# Patient Record
Sex: Female | Born: 1965 | Race: White | Hispanic: No | Marital: Married | Smoking: Never smoker
Health system: Southern US, Community
[De-identification: ages and names within clinical notes are randomized; demographics above are authoritative.]

## PROBLEM LIST (undated history)

## (undated) DIAGNOSIS — R011 Cardiac murmur, unspecified: Secondary | ICD-10-CM

## (undated) DIAGNOSIS — N924 Excessive bleeding in the premenopausal period: Secondary | ICD-10-CM

## (undated) HISTORY — DX: Cardiac murmur, unspecified: R01.1

## (undated) HISTORY — DX: Excessive bleeding in the premenopausal period: N92.4

---

## 2002-10-16 ENCOUNTER — Other Ambulatory Visit: Admission: RE | Admit: 2002-10-16 | Discharge: 2002-10-16 | Payer: Self-pay | Admitting: Obstetrics and Gynecology

## 2003-11-11 ENCOUNTER — Other Ambulatory Visit: Admission: RE | Admit: 2003-11-11 | Discharge: 2003-11-11 | Payer: Self-pay | Admitting: Obstetrics and Gynecology

## 2004-02-17 ENCOUNTER — Emergency Department (HOSPITAL_COMMUNITY): Admission: EM | Admit: 2004-02-17 | Discharge: 2004-02-17 | Payer: Self-pay | Admitting: Emergency Medicine

## 2004-12-26 ENCOUNTER — Other Ambulatory Visit: Admission: RE | Admit: 2004-12-26 | Discharge: 2004-12-26 | Payer: Self-pay | Admitting: Obstetrics and Gynecology

## 2005-03-13 ENCOUNTER — Ambulatory Visit (HOSPITAL_COMMUNITY): Admission: RE | Admit: 2005-03-13 | Discharge: 2005-03-13 | Payer: Self-pay | Admitting: Obstetrics and Gynecology

## 2010-02-24 ENCOUNTER — Encounter: Admission: RE | Admit: 2010-02-24 | Discharge: 2010-02-24 | Payer: Self-pay | Admitting: Obstetrics and Gynecology

## 2014-07-28 ENCOUNTER — Other Ambulatory Visit (HOSPITAL_COMMUNITY): Payer: Self-pay | Admitting: Obstetrics and Gynecology

## 2014-07-28 ENCOUNTER — Ambulatory Visit (HOSPITAL_COMMUNITY): Payer: Managed Care, Other (non HMO) | Attending: Cardiology | Admitting: Radiology

## 2014-07-28 DIAGNOSIS — R011 Cardiac murmur, unspecified: Secondary | ICD-10-CM | POA: Diagnosis present

## 2014-07-28 NOTE — Progress Notes (Signed)
Echocardiogram performed.  

## 2014-08-18 ENCOUNTER — Ambulatory Visit: Payer: Managed Care, Other (non HMO) | Admitting: Cardiology

## 2014-09-09 ENCOUNTER — Encounter: Payer: Self-pay | Admitting: Cardiology

## 2014-09-09 ENCOUNTER — Ambulatory Visit (INDEPENDENT_AMBULATORY_CARE_PROVIDER_SITE_OTHER): Payer: Managed Care, Other (non HMO) | Admitting: Cardiology

## 2014-09-09 VITALS — BP 124/76 | HR 59 | Ht 63.0 in | Wt 136.0 lb

## 2014-09-09 DIAGNOSIS — R011 Cardiac murmur, unspecified: Secondary | ICD-10-CM | POA: Diagnosis not present

## 2014-09-09 DIAGNOSIS — I517 Cardiomegaly: Secondary | ICD-10-CM

## 2014-09-09 NOTE — Patient Instructions (Signed)
Medication Instructions:  Your physician recommends that you continue on your current medications as directed. Please refer to the Current Medication list given to you today.   Labwork: None   Testing/Procedures: Your physician has requested that you have an exercise tolerance test. For further information please visit [email or web address] Please also follow instruction sheet, as given.  Your physician has requested that you have an echocardiogram. Echocardiography is a painless test that uses sound waves to create images of your heart. It provides your doctor with information about the size and shape of your heart and how well your heart's chambers and valves are working. This procedure takes approximately one hour. There are no restrictions for this procedure. ( To be scheduled in 2 years)  Follow-Up: Your physician wants you to follow-up in: 2 years with Dr.Nelson You will receive a reminder letter in the mail two months in advance. If you don't receive a letter, please call our office to schedule the follow-up appointment.   Any Other Special Instructions Will Be Listed Below (If Applicable).

## 2014-09-09 NOTE — Progress Notes (Signed)
Patient ID: Felicia Rios Dudzinski, female   DOB: Jun 13, 1965, 49 y.o.   MRN: [id number]      Cardiology Office Note   Date:  09/09/2014   ID:  Felicia Rios Sheppard, DOB Jun 13, 1965, MRN [id number]  PCP:  No primary care provider on file.  Cardiologist:   Lars MassonNELSON, Dedee Liss H, MD   Chief complain: Murmur, LVH   History of Present Illness: Felicia Rios Picking is a 49 y.o. female who presents for evaluation of murmur and LVH. The patient is a very pleasant and healthy young female who has no prior medical history and is currently not taking any medication. She was found to have murmur on her annual physical exam and was referred for echocardiogram. Echo showed mild concentric LVH and moderate basal septal hypertrophy (personally reviewed). The patient states that she is very active, exercising on a daily basis and is asymptomatic. She denies CP, DOE, LE edema, orthopnea. She denies any family h/o CAD, CHF or SCD. No h/o dizziness or syncope.    Past Medical History  Diagnosis Date  . Perimenopausal menorrhagia   . Systolic murmur    No past surgical history on file.  No current outpatient prescriptions on file.   No current facility-administered medications for this visit.   Allergies:   Review of patient's allergies indicates no known allergies.   Social History:  The patient  reports that she has never smoked. She has never used smokeless tobacco.   Family History:  The patient's family history includes Breast cancer in her maternal aunt and maternal grandmother; Healthy in her brother and sister; Hypertension in her father and mother; Lung cancer in her mother.   ROS:  Please see the history of present illness.   Otherwise, review of systems are positive for none.   All other systems are reviewed and negative.   PHYSICAL EXAM: VS:  BP 124/76 mmHg  Pulse 59  Ht 5\' 3"  (1.6 m)  Wt 136 lb (61.689 kg)  BMI 24.10 kg/m2  LMP 05/11/2014 , BMI Body mass index is 24.1 kg/(m^2). GEN: Well  nourished, well developed, in no acute distress HEENT: normal Neck: no JVD, carotid bruits, or masses Cardiac: RRR; 2/6 systolic murmur, rubs, or gallops,no edema  Respiratory:  clear to auscultation bilaterally, normal work of breathing GI: soft, nontender, nondistended, + BS MS: no deformity or atrophy Skin: warm and dry, no rash Neuro:  Strength and sensation are intact Psych: euthymic mood, full affect   EKG:  EKG is ordered today. The ekg ordered today demonstrates SR, normal ECG  Recent Labs: No results found for requested labs within last 365 days.   Lipid Panel No results found for: CHOL, TRIG, HDL, CHOLHDL, VLDL, LDLCALC, LDLDIRECT    Wt Readings from Last 3 Encounters:  09/09/14 136 lb (61.689 kg)    Echocardiogram 07/2014  - Left ventricle: The cavity size was normal. There was mild focal basal hypertrophy of the septum. Systolic function was normal. The estimated ejection fraction was in the range of 55% to 60%. Wall motion was normal; there were no regional wall motion abnormalities. Doppler parameters are consistent with abnormal left ventricular relaxation (grade 1 diastolic dysfunction). - Aortic valve: There was no stenosis. There was trivial regurgitation. - Mitral valve: There was no significant regurgitation. - Right ventricle: The cavity size was normal. Systolic function was normal. - Pulmonary arteries: No complete TR doppler jet so unable to estimate PA systolic pressure. - Inferior vena cava: The vessel was normal in size.  The respirophasic diameter changes were in the normal range (>= 50%), consistent with normal central venous pressure.  Impressions:  - Normal LV size with mild focal basal septal hypertrophy. EF 55-60%. Normal RV size and systolic function. No significant valvular abnormalities.    ASSESSMENT AND PLAN:  1.   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  Also, I have personally reviewed patient's echocardiogram and there is no suspicion for hypertrophic cardiomyopathy - basal septum measures 14 mm, typically > 18 mm in HCM.   If normal exercise treadmill test I would follow in 2 years with repeat echocardiogram, however I don't expect any change since HCM phenotype is usually expressed in 40'.  Current medicines are reviewed at length with the patient today.  The patient does not have concerns regarding medicines.  The following changes have been made:  no change  Labs/ tests ordered today include:   Orders Placed This Encounter  Procedures  . Exercise Tolerance Test  . EKG 12-Lead     Disposition:   FU with Tobias AlexanderNELSON, Carson Bogden H in 2 year  Signed, Lars MassonNELSON, Armany Mano H, MD  09/09/2014 2:47 PM    [facility] [address], [place], [place]  [address] Phone: [phone]; Fax: [phone]

## 2014-09-15 ENCOUNTER — Encounter: Payer: Self-pay | Admitting: Obstetrics and Gynecology

## 2014-10-08 ENCOUNTER — Encounter: Payer: Self-pay | Admitting: Physician Assistant

## 2014-10-22 ENCOUNTER — Encounter: Payer: Managed Care, Other (non HMO) | Admitting: Physician Assistant

## 2014-11-11 ENCOUNTER — Telehealth (HOSPITAL_COMMUNITY): Payer: Self-pay

## 2014-11-11 NOTE — Telephone Encounter (Signed)
Encounter complete. 

## 2014-11-16 ENCOUNTER — Ambulatory Visit (HOSPITAL_COMMUNITY)
Admission: RE | Admit: 2014-11-16 | Discharge: 2014-11-16 | Disposition: A | Discharge status: Home / Self Care | Payer: Managed Care, Other (non HMO) | Source: Ambulatory Visit | Attending: Cardiology | Admitting: Cardiology

## 2014-11-16 DIAGNOSIS — I517 Cardiomegaly: Secondary | ICD-10-CM | POA: Diagnosis not present

## 2014-11-16 LAB — EXERCISE TOLERANCE TEST
Estimated workload: 13.4 METS
Exercise duration (min): 11 min
MPHR: 170 {beats}/min
Peak HR: 173 {beats}/min
Percent HR: 101 %
RPE: 15
Rest HR: 83 {beats}/min

## 2014-11-22 ENCOUNTER — Telehealth: Payer: Self-pay | Admitting: Cardiology

## 2014-11-22 ENCOUNTER — Telehealth: Payer: Self-pay | Admitting: *Deleted

## 2014-11-22 DIAGNOSIS — I10 Essential (primary) hypertension: Secondary | ICD-10-CM | POA: Insufficient documentation

## 2014-11-22 MED ORDER — LISINOPRIL 2.5 MG PO TABS: 2.5000 mg | ORAL_TABLET | Freq: Every day | ORAL | Status: DC

## 2014-11-22 NOTE — Telephone Encounter (Signed)
Informed the pt that her GXT results are in, and the preliminary has been read, but Dr Delton SeeNelson has not given her final interpretation on this study yet.  Informed the pt that once she reviews and advises on this study, I will follow-up with her based on what she said.  Pt verbalized understanding and agrees with this plan.

## 2014-11-22 NOTE — Telephone Encounter (Signed)
Pt calling to ask if its safe to exercise, walk daily.  Advised the pt that, that's advisable for her to have an exercise regimen daily. Pt verbalized understanding and gracious for all the assistance provided.

## 2014-11-22 NOTE — Telephone Encounter (Signed)
She has Negative stress test for ischemia but hypertensive response to stress (and left ventricular hypertrophy on echocardiogram).         I would start her on lisinopril 2.5 mg po daily followed by BMP in 1 month.             ----- Message -----     From: Loa SocksIvy M Karsynn Deweese, LPN     Sent: 1/61/09607/18/2016 10:28 AM      To: Lars MassonKatarina H Nelson, MD    Contacted the pt to inform her that per Dr Delton SeeNelson her ETT results showed that she had a negative stress test for ischemia but hypertensive response to stress (and left ventricular hypertrophy on echo). Informed the pt that per Dr Delton SeeNelson she recommends the pt start taking lisinopril 2.5 mg po daily and have a lab (BMET) in one month.  Confirmed the pharmacy of choice with the pt.  Scheduled the pt a lab appt to check a BMET on 12/23/14 at our office.  Pt verbalized understanding and agrees with this plan.

## 2014-11-22 NOTE — Telephone Encounter (Signed)
New message ° ° ° ° °Want stress test results °

## 2014-11-22 NOTE — Telephone Encounter (Signed)
She has Negative stress test for ischemia but hypertensive response to stress (and left ventricular hypertrophy on echocardiogram).         I would start her on lisinopril 2.5 mg po daily followed by BMP in 1 month.             ----- Message -----     From: Ivy M Martin, LPN     Sent: 11/22/2014 10:28 AM      To: Katarina H Nelson, MD    Contacted the pt to inform her that per Dr Nelson her ETT results showed that she had a negative stress test for ischemia but hypertensive response to stress (and left ventricular hypertrophy on echo). Informed the pt that per Dr Nelson she recommends the pt start taking lisinopril 2.5 mg po daily and have a lab (BMET) in one month.  Confirmed the pharmacy of choice with the pt.  Scheduled the pt a lab appt to check a BMET on 12/23/14 at our office.  Pt verbalized understanding and agrees with this plan. 

## 2014-11-22 NOTE — Telephone Encounter (Signed)
Follow up      [facility]

## 2014-12-23 ENCOUNTER — Other Ambulatory Visit (INDEPENDENT_AMBULATORY_CARE_PROVIDER_SITE_OTHER): Payer: Managed Care, Other (non HMO) | Admitting: *Deleted

## 2014-12-23 DIAGNOSIS — I10 Essential (primary) hypertension: Secondary | ICD-10-CM

## 2014-12-23 LAB — BASIC METABOLIC PANEL
BUN: 13 mg/dL (ref 6–23)
CO2: 28 mEq/L (ref 19–32)
Calcium: 9.3 mg/dL (ref 8.4–10.5)
Chloride: 104 mEq/L (ref 96–112)
Creatinine, Ser: 0.84 mg/dL (ref 0.40–1.20)
GFR: 76.06 mL/min (ref >60.00)
Glucose, Bld: 118 mg/dL — ABNORMAL HIGH (ref 70–99)
Potassium: 4 mEq/L (ref 3.5–5.1)
Sodium: 138 mEq/L (ref 135–145)

## 2015-02-09 ENCOUNTER — Ambulatory Visit (INDEPENDENT_AMBULATORY_CARE_PROVIDER_SITE_OTHER): Payer: Managed Care, Other (non HMO) | Admitting: Cardiology

## 2015-02-09 ENCOUNTER — Encounter: Payer: Self-pay | Admitting: Cardiology

## 2015-02-09 VITALS — BP 124/72 | HR 71 | Ht 63.0 in | Wt 136.6 lb

## 2015-02-09 DIAGNOSIS — R000 Tachycardia, unspecified: Secondary | ICD-10-CM | POA: Diagnosis not present

## 2015-02-09 DIAGNOSIS — I517 Cardiomegaly: Secondary | ICD-10-CM | POA: Diagnosis not present

## 2015-02-09 DIAGNOSIS — I10 Essential (primary) hypertension: Secondary | ICD-10-CM | POA: Diagnosis not present

## 2015-02-09 MED ORDER — ATENOLOL 25 MG PO TABS: 25.0000 mg | ORAL_TABLET | Freq: Every day | ORAL | Status: DC

## 2015-02-09 NOTE — Progress Notes (Signed)
Patient ID: Felicia Rios, female   DOB: 1966/02/13, 49 y.o.   MRN: [id number]      Cardiology Office Note   Date:  02/09/2015   ID:  Felicia Rios, DOB [birth year], MRN [id number]  PCP:  Delorse Lek, MD  Cardiologist:   Lars Masson, MD   Chief complain: Murmur, LVH   History of Present Illness: Felicia Rios is a 49 y.o. female who presents for evaluation of murmur and LVH. The patient is a very pleasant and healthy young female who has no prior medical history and is currently not taking any medication. She was found to have murmur on her annual physical exam and was referred for echocardiogram. Echo showed mild concentric LVH and moderate basal septal hypertrophy (personally reviewed). The patient states that she is very active, exercising on a daily basis and is asymptomatic. She denies CP, DOE, LE edema, orthopnea. She denies any family h/o CAD, CHF or SCD. No h/o dizziness or syncope.   She underwent an exercise treadmill stress test that showed excellent exercise capacity and no ischemia but hypertensive response to stress. She was started on lisinopril. Today she denies CP, SOB, however she states that she is tachycardic while running slowly (approximately 3 miles per day). Her HR goes up to 190 BPM and is associated with DOE.    Past Medical History  Diagnosis Date  . Perimenopausal menorrhagia   . Systolic murmur    No past surgical history on file.  Current Outpatient Prescriptions  Medication Sig Dispense Refill  . lisinopril (PRINIVIL,ZESTRIL) 2.5 MG tablet Take 1 tablet (2.5 mg total) by mouth daily. 90 tablet 3  . Multiple Vitamins-Minerals (MULTIVITAMIN WITH MINERALS) tablet Take 1 tablet by mouth daily.     No current facility-administered medications for this visit.   Allergies:   Review of patient's allergies indicates no known allergies.   Social History:  The patient  reports that she has never smoked. She has never used smokeless tobacco.  She reports that she drinks alcohol. She reports that she does not use illicit drugs.   Family History:  The patient's family history includes Breast cancer in her maternal aunt and maternal grandmother; Healthy in her brother and sister; Hypertension in her father and mother; Lung cancer in her mother.   ROS:  Please see the history of present illness.   Otherwise, review of systems are positive for none.   All other systems are reviewed and negative.   PHYSICAL EXAM: VS:  BP 124/72 mmHg  Pulse 71  Ht  (1.6 m)  Wt 136 lb 9.6 oz (61.961 kg)  BMI 24.20 kg/m2 , BMI Body mass index is 24.2 kg/(m^2). GEN: Well nourished, well developed, in no acute distress HEENT: normal Neck: no JVD, carotid bruits, or masses Cardiac: RRR; 2/6 systolic murmur, rubs, or gallops,no edema  Respiratory:  clear to auscultation bilaterally, normal work of breathing GI: soft, nontender, nondistended, + BS MS: no deformity or atrophy Skin: warm and dry, no rash Neuro:  Strength and sensation are intact Psych: euthymic mood, full affect   EKG:  EKG is ordered today. The ekg ordered today demonstrates SR, normal ECG  Recent Labs: 12/23/2014: BUN 13; Creatinine, Ser 0.84; Potassium 4.0; Sodium 138   Lipid Panel No results found for: CHOL, TRIG, HDL, CHOLHDL, VLDL, LDLCALC, LDLDIRECT    Wt Readings from Last 3 Encounters:  02/09/15 136 lb 9.6 oz (61.961 kg)  09/09/14 136 lb (61.689 kg)    Echocardiogram  07/2014  - Left ventricle: The cavity size was normal. There was mild focal basal hypertrophy of the septum. Systolic function was normal. The estimated ejection fraction was in the range of 55% to 60%. Wall motion was normal; there were no regional wall motion abnormalities. Doppler parameters are consistent with abnormal left ventricular relaxation (grade 1 diastolic dysfunction). - Aortic valve: There was no stenosis. There was trivial regurgitation. - Mitral valve: There was no  significant regurgitation. - Right ventricle: The cavity size was normal. Systolic function was normal. - Pulmonary arteries: No complete TR doppler jet so unable to estimate PA systolic pressure. - Inferior vena cava: The vessel was normal in size. The respirophasic diameter changes were in the normal range (>= 50%), consistent with normal central venous pressure.  Impressions: - Normal LV size with mild focal basal septal hypertrophy. EF 55-60%. Normal RV size and systolic function. No significant valvular abnormalities.  ETT: 11/16/2014 Response to Stress There was no ST segment deviation noted during stress.  Arrhythmias during stress: none.  Arrhythmias during recovery: none.  There were no significant arrhythmias noted during the test.  ECG was interpretable and conclusive.  There was significant baseline artifact at peak exercise. While somewhat difficult to interpret there is no suggestion of ischemia and her exercise tolerance was excellent without symptoms.   The patient had an excellent exercise tolerance. There was no chest pain. There was an appropriate level of dyspnea. There were no arrhythmias, a normal heart rate response. There was a hypertensive BP response. There were no ischemic ST T wave changes and a normal heart rate recovery.  Hypertensive BP response. With a low pretest probability of obstructive coronary disease I would not suggest further ischemia testing. If however, there is a more moderate suspicion of any obstructive coronary disease given the baseline artifact with peak exercise more sensitive testing might be suggested.  She will follow with Dr. Delton See for her hypertensive blood pressure response    ASSESSMENT AND PLAN:  1.   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 Also, I have personally reviewed patient's  echocardiogram and there is no suspicion for hypertrophic cardiomyopathy - basal septum measures 14 mm, typically > 18 mm in HCM.  Started on lisinopril, however tachycardia during exercise, we will switch to atenolol 25 mg po daily.   2. Exercise induced tachycardia - max HR should be < 160 BPM, start atenolol 25 mg po daily.    Disposition:   FU with Zakariah Urwin H in 6 months.  Signed, Lars Masson, MD  02/09/2015 10:30 AM    [facility] [address] [place], [place], [place]  [address] Phone: [phone]; Fax: [phone]

## 2015-02-09 NOTE — Patient Instructions (Signed)
Medication Instructions:   STOP LISINOPRIL NOW  START ATENOLOL 25 MG ONCE DAILY      Follow-Up:  Your physician wants you to follow-up in: 6 MONTHS WITH DR Johnell Comings will receive a reminder letter in the mail two months in advance. If you don't receive a letter, please call our office to schedule the follow-up appointment.

## 2015-02-24 ENCOUNTER — Encounter: Payer: Self-pay | Admitting: Cardiology

## 2015-02-25 NOTE — Telephone Encounter (Signed)
Lm to call back

## 2015-02-28 MED ORDER — ATENOLOL 25 MG PO TABS: 12.5000 mg | ORAL_TABLET | Freq: Every day | ORAL | Status: DC

## 2015-02-28 NOTE — Telephone Encounter (Signed)
Please call patient today and instruct the patient to take just half of atenolol daily = 12.5 mg po daily.        Felicia Rios, please call her in 1 week and ask how she feels with a new dose.        Thank you!    Notified the pt that per Dr Delton SeeNelson, based on the information she mycharted us on 10/20, Dr Delton SeeNelson recommends that the pt should cut her atenolol in 1/2 and start taking atenolol 12.5 mg po daily, then either call us or mychart us back in one week to report how she is feeling on the new dose.  Pt states she does not need any refills of this medication at this time, and she has a pill splitter at home to cut the 25 mg atenolol in 1/2.  Pt reports that she will notify us of her current symptoms in one week via mychart.  Pt verbalized understanding and agrees with this plan.

## 2015-03-04 ENCOUNTER — Other Ambulatory Visit: Payer: Self-pay | Admitting: Cardiology

## 2015-03-04 ENCOUNTER — Telehealth: Payer: Self-pay | Admitting: Cardiology

## 2015-03-04 MED ORDER — METOPROLOL TARTRATE 25 MG PO TABS: 12.5000 mg | ORAL_TABLET | Freq: Two times a day (BID) | ORAL | Status: DC

## 2015-03-04 NOTE — Telephone Encounter (Signed)
Called patient and advised her to discontinue the atenolol and begin metoprolol 12.5mg  twice a day.  Sent script to her pharmacy, [facility] [place].

## 2015-03-04 NOTE — Telephone Encounter (Signed)
Called patient, patient stated that she is still feeling weird with the reduced atenolol dose (25mg  to 12.5mg ).  Heart beat seems more pronounced or obvious feeling when she is inactive.  Resting heart rate now drops back into the 50's vs 40's from 150's when running.  Wants to know what she should do.

## 2015-03-04 NOTE — Telephone Encounter (Signed)
Please switch her from atenolol to metoprolol 12.5 mg po BID.

## 2015-03-04 NOTE — Telephone Encounter (Signed)
NewMessage  Pt calling to speak w RN concerning continued problems w/ Atenolol. Please call back and discuss.

## 2015-03-11 ENCOUNTER — Encounter: Payer: Self-pay | Admitting: Cardiology

## 2015-03-11 NOTE — Telephone Encounter (Signed)
Notified the pt that based on her mychart message from today to Dr Nelson, she recommends thDelton Seeat she stop metoprolol and schedule a follow-up appt with Dr Delton SeeNelson sometime soon.  Informed the pt that someone from our scheduling dept will be calling her, to schedule her a follow-up with Dr Delton SeeNelson.  Advised the pt to continue mycharting us with any concerns. Pt verbalized understanding and agrees with this plan.

## 2015-03-17 ENCOUNTER — Encounter: Payer: Self-pay | Admitting: Cardiology

## 2015-03-17 ENCOUNTER — Ambulatory Visit (INDEPENDENT_AMBULATORY_CARE_PROVIDER_SITE_OTHER): Payer: Managed Care, Other (non HMO) | Admitting: Cardiology

## 2015-03-17 VITALS — BP 130/72 | HR 73 | Ht 63.0 in | Wt 134.0 lb

## 2015-03-17 DIAGNOSIS — I517 Cardiomegaly: Secondary | ICD-10-CM

## 2015-03-17 DIAGNOSIS — R000 Tachycardia, unspecified: Secondary | ICD-10-CM

## 2015-03-17 DIAGNOSIS — I10 Essential (primary) hypertension: Secondary | ICD-10-CM

## 2015-03-17 MED ORDER — DILTIAZEM HCL ER COATED BEADS 120 MG PO CP24: 120.0000 mg | ORAL_CAPSULE | Freq: Every day | ORAL | Status: DC

## 2015-03-17 NOTE — Progress Notes (Signed)
Patient ID: Felicia Rios, female   DOB: 1966/01/29, 49 y.o.   MRN: [id number]      Cardiology Office Note   Date:  03/17/2015   ID:  Felicia Rios, DOB 1966/01/29, MRN [id number]  PCP:  Delorse LekBURNETT,BRENT A, MD  Cardiologist:   Lars MassonNELSON, Maximina Pirozzi H, MD   Chief complain: Murmur, LVH   History of Present Illness: Felicia Rios is a 49 y.o. female who presents for evaluation of murmur and LVH. The patient is a very pleasant and healthy young female who has no prior medical history and is currently not taking any medication. She was found to have murmur on her annual physical exam and was referred for echocardiogram. Echo showed mild concentric LVH and moderate basal septal hypertrophy (personally reviewed). The patient states that she is very active, exercising on a daily basis and is asymptomatic. She denies CP, DOE, LE edema, orthopnea. She denies any family h/o CAD, CHF or SCD. No h/o dizziness or syncope.   She underwent an exercise treadmill stress test that showed excellent exercise capacity and no ischemia but hypertensive response to stress. She was started on lisinopril. Today she denies CP, SOB, however she states that she is tachycardic while running slowly (approximately 3 miles per day). Her HR goes up to 190 BPM and is associated with DOE.   03/17/2015 - she couldn't tolerate atenolol and metoprolol, made her tired and bradycardic. She feels well, no CP, DOE, but still gets tachycardic during exercise - 140 with walking, 190 with running short distances.   Past Medical History  Diagnosis Date  . Perimenopausal menorrhagia   . Systolic murmur    No past surgical history on file.  Current Outpatient Prescriptions  Medication Sig Dispense Refill  . Multiple Vitamins-Minerals (MULTIVITAMIN WITH MINERALS) tablet Take 1 tablet by mouth daily.     No current facility-administered medications for this visit.   Allergies:   Atenolol   Social History:  The patient  reports  that she has never smoked. She has never used smokeless tobacco. She reports that she drinks alcohol. She reports that she does not use illicit drugs.   Family History:  The patient's family history includes Breast cancer in her maternal aunt and maternal grandmother; Healthy in her brother and sister; Hypertension in her father and mother; Lung cancer in her mother.   ROS:  Please see the history of present illness.   Otherwise, review of systems are positive for none.   All other systems are reviewed and negative.   PHYSICAL EXAM: VS:  BP 130/72 mmHg  Pulse 73  Ht 5\' 3"  (1.6 m)  Wt 134 lb (60.782 kg)  BMI 23.74 kg/m2  SpO2 99% , BMI Body mass index is 23.74 kg/(m^2). GEN: Well nourished, well developed, in no acute distress HEENT: normal Neck: no JVD, carotid bruits, or masses Cardiac: RRR; 2/6 systolic murmur, rubs, or gallops,no edema  Respiratory:  clear to auscultation bilaterally, normal work of breathing GI: soft, nontender, nondistended, + BS MS: no deformity or atrophy Skin: warm and dry, no rash Neuro:  Strength and sensation are intact Psych: euthymic mood, full affect   EKG:  EKG is ordered today. The ekg ordered today demonstrates SR, normal ECG  Recent Labs: 12/23/2014: BUN 13; Creatinine, Ser 0.84; Potassium 4.0; Sodium 138   Lipid Panel No results found for: CHOL, TRIG, HDL, CHOLHDL, VLDL, LDLCALC, LDLDIRECT    Wt Readings from Last 3 Encounters:  03/17/15 134 lb (60.782 kg)  02/09/15  136 lb 9.6 oz (61.961 kg)  09/09/14 136 lb (61.689 kg)    Echocardiogram 07/2014  - Left ventricle: The cavity size was normal. There was mild focal basal hypertrophy of the septum. Systolic function was normal. The estimated ejection fraction was in the range of 55% to 60%. Wall motion was normal; there were no regional wall motion abnormalities. Doppler parameters are consistent with abnormal left ventricular relaxation (grade 1 diastolic dysfunction). - Aortic  valve: There was no stenosis. There was trivial regurgitation. - Mitral valve: There was no significant regurgitation. - Right ventricle: The cavity size was normal. Systolic function was normal. - Pulmonary arteries: No complete TR doppler jet so unable to estimate PA systolic pressure. - Inferior vena cava: The vessel was normal in size. The respirophasic diameter changes were in the normal range (>= 50%), consistent with normal central venous pressure.  Impressions: - Normal LV size with mild focal basal septal hypertrophy. EF 55-60%. Normal RV size and systolic function. No significant valvular abnormalities.  ETT: 11/16/2014 Response to Stress There was no ST segment deviation noted during stress.  Arrhythmias during stress: none.  Arrhythmias during recovery: none.  There were no significant arrhythmias noted during the test.  ECG was interpretable and conclusive.  There was significant baseline artifact at peak exercise. While somewhat difficult to interpret there is no suggestion of ischemia and her exercise tolerance was excellent without symptoms.   The patient had an excellent exercise tolerance. There was no chest pain. There was an appropriate level of dyspnea. There were no arrhythmias, a normal heart rate response. There was a hypertensive BP response. There were no ischemic ST T wave changes and a normal heart rate recovery.  Hypertensive BP response. With a low pretest probability of obstructive coronary disease I would not suggest further ischemia testing. If however, there is a more moderate suspicion of any obstructive coronary disease given the baseline artifact with peak exercise more sensitive testing might be suggested.  She will follow with Dr. Delton See for her hypertensive blood pressure response    ASSESSMENT AND PLAN:  1.   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 Also, I have personally reviewed patient's echocardiogram and there is no suspicion for hypertrophic cardiomyopathy - basal septum measures 14 mm, typically > 18 mm in HCM.  Started on lisinopril, however tachycardia during exercise, we will switch to cardizem CD 120 mg po daily.  2. Exercise induced tachycardia - max HR should be < 160 BPM, start Cardizem 120 mg po daily.    Disposition:   FU with Broughton Eppinger H in 6 months.  Signed, Lars Masson, MD  03/17/2015 2:13 PM    [facility] [address] [place], [place], [place]  [address] Phone: [phone]; Fax: [phone]

## 2015-03-17 NOTE — Patient Instructions (Signed)
Medication Instructions:   STOP TAKING METOPROLOL  START TAKING CARDIZEM CD 120 MG ONCE DAILY      Follow-Up:  Your physician wants you to follow-up in: 6 MONTHS WITH DR Johnell ComingsNELSON You will receive a reminder letter in the mail two months in advance. If you don't receive a letter, please call our office to schedule the follow-up appointment.      If you need a refill on your cardiac medications before your next appointment, please call your pharmacy.

## 2015-03-23 ENCOUNTER — Encounter: Payer: Self-pay | Admitting: Cardiology

## 2015-03-24 MED ORDER — DILTIAZEM HCL ER COATED BEADS 240 MG PO CP24: 240.0000 mg | ORAL_CAPSULE | Freq: Every day | ORAL | Status: DC

## 2015-03-24 NOTE — Telephone Encounter (Signed)
Pt aware to increase Cardizem CD to 240 mg a day.  She will use the 120mg  she has and take 2 at a time.  She will call back when she is ready for a refill.

## 2015-04-06 ENCOUNTER — Telehealth: Payer: Self-pay | Admitting: Cardiology

## 2015-04-06 NOTE — Telephone Encounter (Signed)
Informed the pt that per Felicia Rios our PharmD, taking bactrim with her Cardizem CD is completely safe with no contraindications.  Pt verbalized understanding and gracious for all the assistance provided.

## 2015-04-06 NOTE — Telephone Encounter (Signed)
New message     Calling to see if it is ok to take bactrim 160mg  ---1 tablet bid for 3 days---for UTI.  Will this interfere with her other cardiac medications?  Please advise

## 2015-04-27 ENCOUNTER — Encounter: Payer: Self-pay | Admitting: Cardiology

## 2015-05-03 MED ORDER — METOPROLOL TARTRATE 25 MG PO TABS: 25.0000 mg | ORAL_TABLET | Freq: Two times a day (BID) | ORAL | Status: DC

## 2015-05-03 NOTE — Telephone Encounter (Signed)
Please d/c cardizem and start metoprolol 25 mg po BID.        ----- Message -----     From: Loa SocksIvy M Braeley Buskey, LPN     Sent: 11/91/478212/21/2016 10:55 AM      To: Lars MassonKatarina H Nelson, MD    Subject: FW: Non-Urgent Medical Question        Notified the pt that Dr Delton SeeNelson reviewed her mychart message from 12/21, and she recommends that the pt stop cardizem and start metoprolol 25 mg po BID.  Noted that the pt had this updated as an allergy, and confirmed with her that she just felt different when she took atenolol and felt some mild chest pains, but she is still interested in giving metoprolol a try.  Confirmed the pharmacy of choice with the pt.  Pt request that only a month supply to be sent to her pharmacy, to see if she can tolerate this med or not.  Pt verbalized understanding and agrees with this plan.

## 2015-05-05 ENCOUNTER — Telehealth: Payer: Self-pay | Admitting: Cardiology

## 2015-05-05 MED ORDER — IVABRADINE HCL 5 MG PO TABS: 5.0000 mg | ORAL_TABLET | Freq: Two times a day (BID) | ORAL | Status: DC

## 2015-05-05 NOTE — Telephone Encounter (Signed)
Ivabradine 5 mg po bid

## 2015-05-05 NOTE — Telephone Encounter (Signed)
Pt calling to inform Dr Delton SeeNelson that she cannot tolerate taking metoprolol at all for her inappropriate sinus tachycardia, for this drops her BP to low.  Pt states beta blockers drop her bp.  Pt states she would like for Dr Delton SeeNelson to advise on a different med regimen for her complaints continue. Informed the pt that I will route this message to Dr Delton SeeNelson for further review and recommendation and follow-up with her thereafter.  Pt verbalized understanding and agrees with this plan.

## 2015-05-05 NOTE — Telephone Encounter (Signed)
Pt is completely opposed to taking metoprolol at all.  Pt states she tried taking 12.5 mg po bid back in October and could not tolerate this.  Will route this message back to Dr Delton SeeNelson to advise on corlanor 5 mg po bid or 7.5 mg, and follow-up with the pt thereafter.

## 2015-05-05 NOTE — Telephone Encounter (Signed)
Notified the pt that per Dr Delton SeeNelson, if she is opposed to taking metoprolol all together, even with cutting in in 1/2, we can try corlanor samples, and submit a prescription to her pharmacy, but it is a low chance of her insurance covering this medication for inappropriate sinus tachycardia.  Informed the pt that I will send this to her pharmacy, and if there needs to be a prior auth ran on this medication, they will fax this to our office, and we can have our prior auth nurse Bonita QuinLinda, contact her insurance company to submit appropriate information needed to try and have this med approved.  Informed the pt that I will leave her samples of this medication at the front desk to pick-up tomorrow.  Confirmed the pharmacy of choice with the pt.  Informed the pt to contact our office if this medication cannot be tolerated.  The pt verbalized understanding and agrees with this plan.  Pt very gracious for all the assistance provided. Updated the pts allergies with intolerance to metoprolol.

## 2015-05-05 NOTE — Telephone Encounter (Signed)
New Message   The medication Metro[employer] is not working and she wants a different perscription

## 2015-05-05 NOTE — Telephone Encounter (Signed)
She can try to cut it in half (12.5 mg po BID), we can also try corlanor samples, but a low chance of insurance coverage.

## 2015-05-10 ENCOUNTER — Telehealth: Payer: Self-pay

## 2015-05-10 NOTE — Telephone Encounter (Signed)
Prior auth for [employer] Health[employer]lanor 5mg  sent to [employer].

## 2015-05-13 ENCOUNTER — Telehealth: Payer: Self-pay

## 2015-05-13 NOTE — Telephone Encounter (Signed)
Corlanor denied by [employer].

## 2015-05-18 NOTE — Telephone Encounter (Signed)
There is nothing I can do, in the past it worked for a couple of patients when they appealed and called their insurance company. K

## 2015-05-19 NOTE — Telephone Encounter (Signed)
Left message for patient to call back  

## 2015-05-19 NOTE — Telephone Encounter (Signed)
Patient received her denial letter in the mail also. i advised her that sometimes if patient do their own appeal, it can be overturned. She is willing to do that.

## 2015-05-26 ENCOUNTER — Telehealth: Payer: Self-pay | Admitting: Cardiology

## 2015-05-26 NOTE — Telephone Encounter (Signed)
New message     Calling to get "intention on appeal process" on the drug corlanor .  Please call

## 2015-05-27 NOTE — Telephone Encounter (Signed)
Left message with Larita Fife.

## 2015-06-17 ENCOUNTER — Telehealth: Payer: Self-pay

## 2015-06-17 NOTE — Telephone Encounter (Signed)
Spoke with patient today. She is requesting we do an appeal for her Corlanor . She said she understood that her doing a letter was the final part of the appeal. Letter of med necessity and clinical notes faxed to [employer].

## 2015-07-11 ENCOUNTER — Telehealth: Payer: Self-pay | Admitting: Cardiology

## 2015-07-11 NOTE — Telephone Encounter (Signed)
Samples placed at the front desk. Tried to reach patient but was unsuccessful.

## 2015-07-11 NOTE — Telephone Encounter (Signed)
Please give samples to this pt.  Also please check with Odette Felicia Rios about the status of this pt filling for a final appeal on this med.

## 2015-07-11 NOTE — Telephone Encounter (Signed)
Should this patient be given samples as it appears that her insurance is not going to cover this?

## 2015-07-11 NOTE — Telephone Encounter (Signed)
New message      Patient calling the office for samples of medication:   1.  What medication and dosage are you requesting samples for?  corlanor 5mg   2.  Are you currently out of this medication? Will be out this week

## 2015-07-12 ENCOUNTER — Encounter: Payer: Self-pay | Admitting: Cardiology

## 2015-08-15 ENCOUNTER — Encounter: Payer: Self-pay | Admitting: Cardiology

## 2015-09-14 ENCOUNTER — Ambulatory Visit (INDEPENDENT_AMBULATORY_CARE_PROVIDER_SITE_OTHER): Payer: Managed Care, Other (non HMO) | Admitting: Cardiology

## 2015-09-14 ENCOUNTER — Encounter: Payer: Self-pay | Admitting: Cardiology

## 2015-09-14 VITALS — BP 104/64 | HR 57 | Ht 63.0 in | Wt 136.0 lb

## 2015-09-14 DIAGNOSIS — R000 Tachycardia, unspecified: Secondary | ICD-10-CM

## 2015-09-14 DIAGNOSIS — I517 Cardiomegaly: Secondary | ICD-10-CM

## 2015-09-14 DIAGNOSIS — R011 Cardiac murmur, unspecified: Secondary | ICD-10-CM | POA: Diagnosis not present

## 2015-09-14 DIAGNOSIS — I10 Essential (primary) hypertension: Secondary | ICD-10-CM

## 2015-09-14 NOTE — Progress Notes (Signed)
Patient ID: Felicia Rios, female   DOB: [birth year], 50 y.o.   MRN: [id number]      Cardiology Office Note   Date:  09/14/2015   ID:  Felicia Rios, DOB 28-Feb-1966, MRN [id number]  PCP:  Delorse Lek, MD  Cardiologist:   Lars Masson, MD   Chief complain: Murmur, LVH   History of Present Illness: Felicia Rios is a 50 y.o. female who presents for evaluation of murmur and LVH. The patient is a very pleasant and healthy young female who has no prior medical history and is currently not taking any medication. She was found to have murmur on her annual physical exam and was referred for echocardiogram. Echo showed mild concentric LVH and moderate basal septal hypertrophy (personally reviewed). The patient states that she is very active, exercising on a daily basis and is asymptomatic. She denies CP, DOE, LE edema, orthopnea. She denies any family h/o CAD, CHF or SCD. No h/o dizziness or syncope.   She underwent an exercise treadmill stress test that showed excellent exercise capacity and no ischemia but hypertensive response to stress. She was started on lisinopril. Today she denies CP, SOB, however she states that she is tachycardic while running slowly (approximately 3 miles per day). Her HR goes up to 190 BPM and is associated with DOE.   09/14/2015- she couldn't tolerate atenolol and metoprolol, because of excessive fatigue cardizem caused very low heart rate and hypotension, started on Corlanor, that she tolerated very well, at peak exercise heart rate gets to 180 but she is asymptomatic with it, I rest her heart rate is in 50s. She runs 3 miles 3-4 times a week. She feels well, no CP, DOE.  Past Medical History  Diagnosis Date  . Perimenopausal menorrhagia   . Systolic murmur    No past surgical history on file.  Current Outpatient Prescriptions  Medication Sig Dispense Refill  . ivabradine (CORLANOR) 5 MG TABS tablet Take 1 tablet (5 mg total) by mouth 2 (two)  times daily with a meal. 180 tablet 3  . Multiple Vitamins-Minerals (MULTIVITAMIN WITH MINERALS) tablet Take 1 tablet by mouth daily.     No current facility-administered medications for this visit.   Allergies:   Atenolol and Metoprolol   Social History:  The patient  reports that she has never smoked. She has never used smokeless tobacco. She reports that she drinks alcohol. She reports that she does not use illicit drugs.   Family History:  The patient's family history includes Breast cancer in her maternal aunt and maternal grandmother; Healthy in her brother and sister; Hypertension in her father and mother; Lung cancer in her mother.   ROS:  Please see the history of present illness.   Otherwise, review of systems are positive for none.   All other systems are reviewed and negative.   PHYSICAL EXAM: VS:  BP 104/64 mmHg  Pulse 57  Ht  (1.6 m)  Wt 136 lb (61.689 kg)  BMI 24.10 kg/m2 , BMI Body mass index is 24.1 kg/(m^2). GEN: Well nourished, well developed, in no acute distress HEENT: normal Neck: no JVD, carotid bruits, or masses Cardiac: RRR; 2/6 systolic murmur, rubs, or gallops,no edema  Respiratory:  clear to auscultation bilaterally, normal work of breathing GI: soft, nontender, nondistended, + BS MS: no deformity or atrophy Skin: warm and dry, no rash Neuro:  Strength and sensation are intact Psych: euthymic mood, full affect   EKG:  EKG is ordered today.  The ekg ordered today demonstrates SR, normal ECG  Recent Labs: 12/23/2014: BUN 13; Creatinine, Ser 0.84; Potassium 4.0; Sodium 138   Lipid Panel No results found for: CHOL, TRIG, HDL, CHOLHDL, VLDL, LDLCALC, LDLDIRECT    Wt Readings from Last 3 Encounters:  09/14/15 136 lb (61.689 kg)  03/17/15 134 lb (60.782 kg)  02/09/15 136 lb 9.6 oz (61.961 kg)    Echocardiogram 07/2014  - Left ventricle: The cavity size was normal. There was mild focal basal hypertrophy of the septum. Systolic function was  normal. The estimated ejection fraction was in the range of 55% to 60%. Wall motion was normal; there were no regional wall motion abnormalities. Doppler parameters are consistent with abnormal left ventricular relaxation (grade 1 diastolic dysfunction). - Aortic valve: There was no stenosis. There was trivial regurgitation. - Mitral valve: There was no significant regurgitation. - Right ventricle: The cavity size was normal. Systolic function was normal. - Pulmonary arteries: No complete TR doppler jet so unable to estimate PA systolic pressure. - Inferior vena cava: The vessel was normal in size. The respirophasic diameter changes were in the normal range (>= 50%), consistent with normal central venous pressure.  Impressions: - Normal LV size with mild focal basal septal hypertrophy. EF 55-60%. Normal RV size and systolic function. No significant valvular abnormalities.  ETT: 11/16/2014 Response to Stress There was no ST segment deviation noted during stress.  Arrhythmias during stress: none.  Arrhythmias during recovery: none.  There were no significant arrhythmias noted during the test.  ECG was interpretable and conclusive.  There was significant baseline artifact at peak exercise. While somewhat difficult to interpret there is no suggestion of ischemia and her exercise tolerance was excellent without symptoms.   The patient had an excellent exercise tolerance. There was no chest pain. There was an appropriate level of dyspnea. There were no arrhythmias, a normal heart rate response. There was a hypertensive BP response. There were no ischemic ST T wave changes and a normal heart rate recovery.  Hypertensive BP response. With a low pretest probability of obstructive coronary disease I would not suggest further ischemia testing. If however, there is a more moderate suspicion of any obstructive coronary disease given the baseline artifact with peak exercise  more sensitive testing might be suggested.  She will follow with Dr. Delton SeeNelson for her hypertensive blood pressure response    ASSESSMENT AND PLAN:  1.   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 Also, I have personally reviewed patient's echocardiogram and there is no suspicion for hypertrophic cardiomyopathy - basal septum measures 14 mm, typically > 18 mm in HCM.  Started on lisinopril, however tachycardia during exercise, we tried cardizem CD 120 mg po daily, she didn't tolerated as she fell bradycardic and hypotensive. She's currently on no blood pressure medication, in the future if coronary doesn't get approved by her insurance company we will switch to [employer] low-dose Cardizem CD 60 mg daily.  2. Exercise induced tachycardia - max HR should be < 160 BPM, doing very well on Corlanor, if denied by insurance, we will switch to Cardizem CD 60 mg daily.  3. Systolic murmur - trivial AI only, we will follow   Disposition:   FU with Saja Bartolini H in 6 months.  Signed, Lars MassonNELSON, Javonni Macke H, MD  09/14/2015 8:01 AM    [facility] [address], [place], [place]  [address] Phone: [phone]; Fax: [phone]

## 2015-09-14 NOTE — Patient Instructions (Signed)
Your physician recommends that you continue on your current medications as directed. Please refer to the Current Medication list given to you today.   Your physician wants you to follow-up in: ONE YEAR WITH DR NELSON You will receive a reminder letter in the mail two months in advance. If you don't receive a letter, please call our office to schedule the follow-up appointment.  

## 2015-10-27 ENCOUNTER — Encounter: Payer: Self-pay | Admitting: Cardiology

## 2015-12-15 ENCOUNTER — Encounter: Payer: Self-pay | Admitting: Cardiology

## 2015-12-22 ENCOUNTER — Telehealth: Payer: Self-pay | Admitting: Cardiology

## 2015-12-22 MED ORDER — DILTIAZEM HCL 60 MG PO TABS: 60.0000 mg | ORAL_TABLET | Freq: Two times a day (BID) | ORAL | 1 refills | Status: DC

## 2015-12-22 NOTE — Telephone Encounter (Signed)
Pt contacted us back to inform us that she received our mychart messages about Dr Lindaann SloughNelson's recommendations, on 2 different options to try, to replace Corlanor, for non-coverage from her Insurance.  Informed the pt that per Dr Delton SeeNelson, the options would be metoprolol 12.5 mg po BID or Cardizem CD 60 mg po daily. Pt states that she prefers trying a month supply of Cardizem CD 60 mg po daily to be sent into her confirmed pharmacy of choice, being she is "intolerant to beta blockers." Pt reports that beta blockers have caused her low BP in the past.  This is updated in her allergies as an intolerance. Informed the pt that I will send the cardizem to her pharmacy for a month supply, and she should communicate to our office how she tolerates this medication, and to request for additional refills if tolerated appropriately.  Pt states that she did not pick up the corlanor samples that were left at the front desk for her to pick up.  Informed the pt that I will remove this from her med list.  Pt verbalized understanding, agrees with this plan, and gracious for all the assistance provided.  Pt states she will inform us through mychart.

## 2015-12-22 NOTE — Telephone Encounter (Signed)
Follow Up:; ° ° °Returning your call. °

## 2015-12-22 NOTE — Telephone Encounter (Signed)
Contacted the pt back again to inform her that Cardizem CD does not come in 60 mg tabs. Informed the pt that I spoke with Dr Delton SeeNelson and she wants her to take the short acting Cardizem 60 mg po BID, to replace this.  Informed the pt that, that would mean she should take this twice daily. Pt verbalized understanding and agrees with this plan.  Pt gracious for all the assistance and follow-up provided.

## 2015-12-31 ENCOUNTER — Encounter: Payer: Self-pay | Admitting: Cardiology

## 2016-01-02 ENCOUNTER — Other Ambulatory Visit: Payer: Self-pay | Admitting: *Deleted

## 2016-01-13 ENCOUNTER — Other Ambulatory Visit: Payer: Self-pay | Admitting: *Deleted

## 2016-01-13 MED ORDER — DILTIAZEM HCL 60 MG PO TABS: ORAL_TABLET | ORAL | 0 refills | Status: AC

## 2017-08-27 ENCOUNTER — Other Ambulatory Visit: Payer: Self-pay | Admitting: Obstetrics and Gynecology

## 2017-08-27 DIAGNOSIS — R928 Other abnormal and inconclusive findings on diagnostic imaging of breast: Secondary | ICD-10-CM

## 2017-09-03 ENCOUNTER — Ambulatory Visit
Admission: RE | Admit: 2017-09-03 | Discharge: 2017-09-03 | Disposition: A | Discharge status: Home / Self Care | Payer: Managed Care, Other (non HMO) | Source: Ambulatory Visit | Attending: Obstetrics and Gynecology | Admitting: Obstetrics and Gynecology

## 2017-09-03 ENCOUNTER — Other Ambulatory Visit: Payer: Self-pay | Admitting: Obstetrics and Gynecology

## 2017-09-03 ENCOUNTER — Ambulatory Visit
Admission: RE | Admit: 2017-09-03 | Discharge: 2017-09-03 | Disposition: A | Discharge status: Home / Self Care | Payer: 59 | Source: Ambulatory Visit | Attending: Obstetrics and Gynecology | Admitting: Obstetrics and Gynecology

## 2017-09-03 DIAGNOSIS — N6320 Unspecified lump in the left breast, unspecified quadrant: Secondary | ICD-10-CM

## 2017-09-03 DIAGNOSIS — R928 Other abnormal and inconclusive findings on diagnostic imaging of breast: Secondary | ICD-10-CM

## 2018-03-06 ENCOUNTER — Ambulatory Visit
Admission: RE | Admit: 2018-03-06 | Discharge: 2018-03-06 | Disposition: A | Discharge status: Home / Self Care | Payer: 59 | Source: Ambulatory Visit | Attending: Obstetrics and Gynecology | Admitting: Obstetrics and Gynecology

## 2018-03-06 DIAGNOSIS — N6320 Unspecified lump in the left breast, unspecified quadrant: Secondary | ICD-10-CM

## 2018-10-20 ENCOUNTER — Other Ambulatory Visit: Payer: Self-pay | Admitting: Obstetrics and Gynecology

## 2018-10-20 DIAGNOSIS — N6320 Unspecified lump in the left breast, unspecified quadrant: Secondary | ICD-10-CM

## 2018-10-30 ENCOUNTER — Ambulatory Visit
Admission: RE | Admit: 2018-10-30 | Discharge: 2018-10-30 | Disposition: A | Discharge status: Home / Self Care | Payer: 59 | Source: Ambulatory Visit | Attending: Obstetrics and Gynecology | Admitting: Obstetrics and Gynecology

## 2018-10-30 ENCOUNTER — Ambulatory Visit: Payer: 59

## 2018-10-30 DIAGNOSIS — N6320 Unspecified lump in the left breast, unspecified quadrant: Secondary | ICD-10-CM

## 2019-05-06 ENCOUNTER — Ambulatory Visit: Payer: 59 | Admitting: Cardiology

## 2019-10-02 ENCOUNTER — Other Ambulatory Visit: Payer: Self-pay | Admitting: Obstetrics and Gynecology

## 2019-10-02 DIAGNOSIS — N6320 Unspecified lump in the left breast, unspecified quadrant: Secondary | ICD-10-CM

## 2019-11-03 ENCOUNTER — Ambulatory Visit
Admission: RE | Admit: 2019-11-03 | Discharge: 2019-11-03 | Disposition: A | Discharge status: Home / Self Care | Payer: 59 | Source: Ambulatory Visit | Attending: Obstetrics and Gynecology | Admitting: Obstetrics and Gynecology

## 2019-11-03 ENCOUNTER — Other Ambulatory Visit: Payer: Self-pay

## 2019-11-03 DIAGNOSIS — N6320 Unspecified lump in the left breast, unspecified quadrant: Secondary | ICD-10-CM

## 2020-12-02 ENCOUNTER — Other Ambulatory Visit: Payer: Self-pay | Admitting: Obstetrics and Gynecology

## 2020-12-02 DIAGNOSIS — Z1231 Encounter for screening mammogram for malignant neoplasm of breast: Secondary | ICD-10-CM

## 2020-12-21 ENCOUNTER — Other Ambulatory Visit: Payer: Self-pay

## 2020-12-21 ENCOUNTER — Ambulatory Visit
Admission: RE | Admit: 2020-12-21 | Discharge: 2020-12-21 | Disposition: A | Discharge status: Home / Self Care | Payer: 59 | Source: Ambulatory Visit | Attending: Obstetrics and Gynecology | Admitting: Obstetrics and Gynecology

## 2020-12-21 DIAGNOSIS — Z1231 Encounter for screening mammogram for malignant neoplasm of breast: Secondary | ICD-10-CM

## 2022-01-18 ENCOUNTER — Other Ambulatory Visit: Payer: Self-pay | Admitting: Obstetrics and Gynecology

## 2022-01-18 DIAGNOSIS — Z1231 Encounter for screening mammogram for malignant neoplasm of breast: Secondary | ICD-10-CM

## 2022-02-08 ENCOUNTER — Ambulatory Visit: Payer: 59

## 2022-03-01 ENCOUNTER — Ambulatory Visit
Admission: RE | Admit: 2022-03-01 | Discharge: 2022-03-01 | Disposition: A | Discharge status: Home / Self Care | Payer: 59 | Source: Ambulatory Visit | Attending: Obstetrics and Gynecology | Admitting: Obstetrics and Gynecology

## 2022-03-01 DIAGNOSIS — Z1231 Encounter for screening mammogram for malignant neoplasm of breast: Secondary | ICD-10-CM

## 2022-06-26 IMAGING — MG MM DIGITAL SCREENING BILAT W/ TOMO AND CAD
8 series · 8 of 24 positions shown · non-contrast
Comparison: Previous exam(s).

CLINICAL DATA: Screening.

EXAM:
DIGITAL SCREENING BILATERAL MAMMOGRAM WITH TOMOSYNTHESIS AND CAD
TECHNIQUE: Bilateral screening digital craniocaudal and mediolateral oblique
mammograms were obtained. Bilateral screening digital breast
tomosynthesis was performed. The images were evaluated with
computer-aided detection.

[L CC synth-2D]
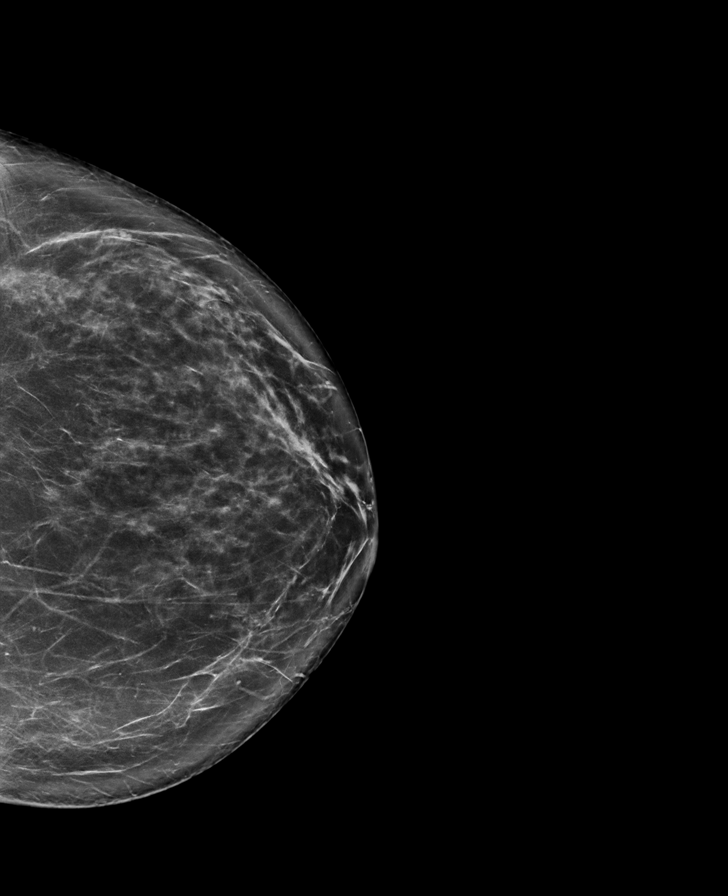

[L MLO synth-2D]
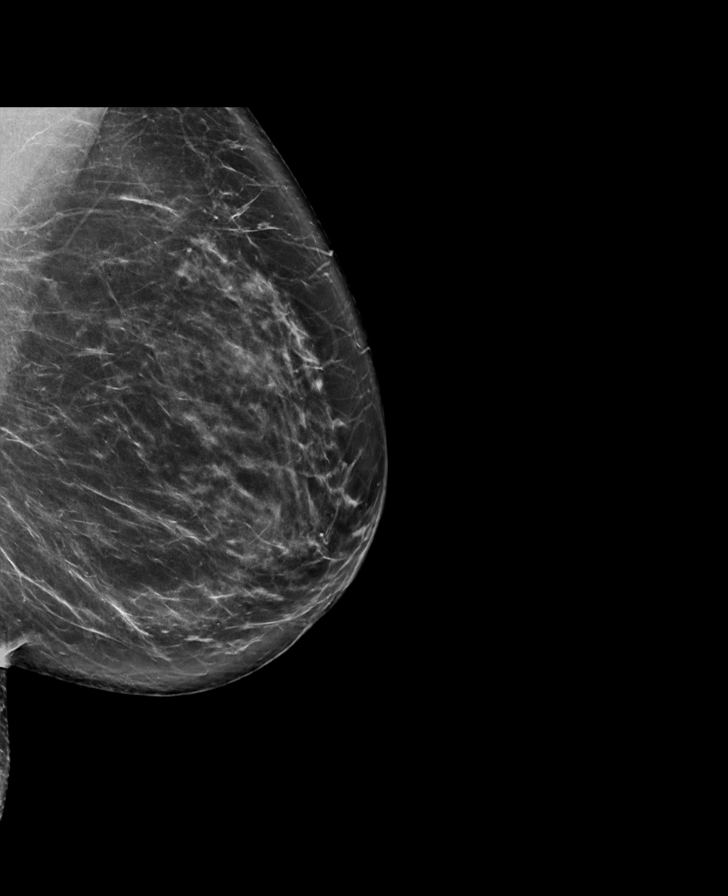

[R MLO synth-2D]
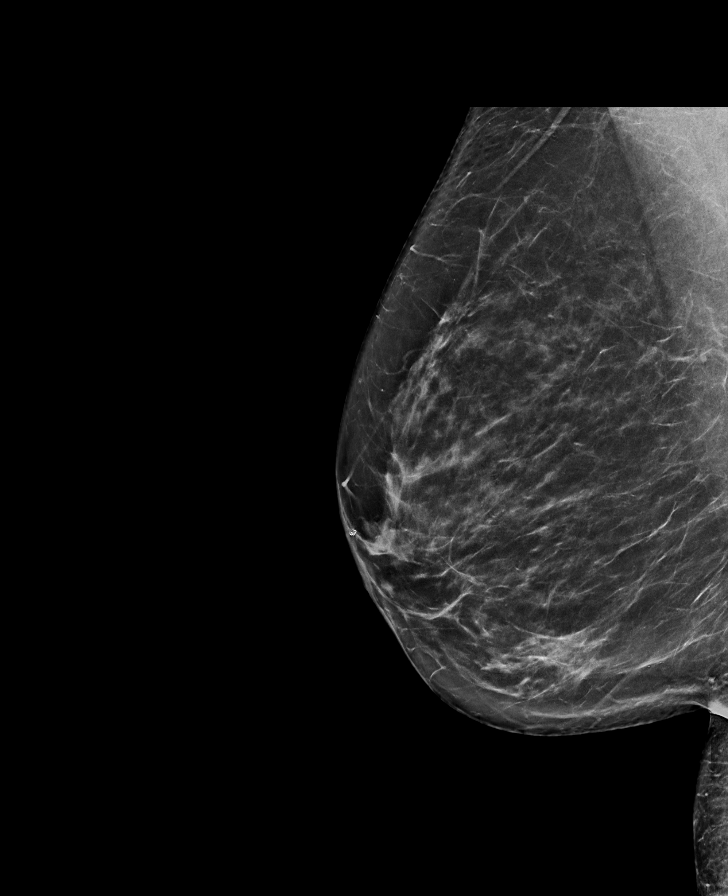

[R CC synth-2D]
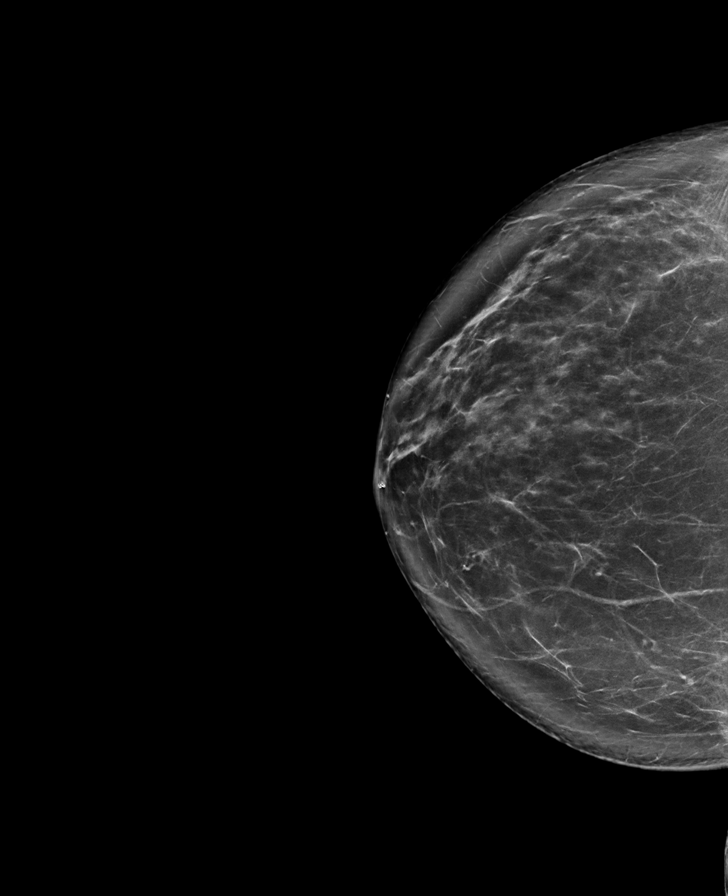

[R MLO tomo · tomo slice 42/83.0]
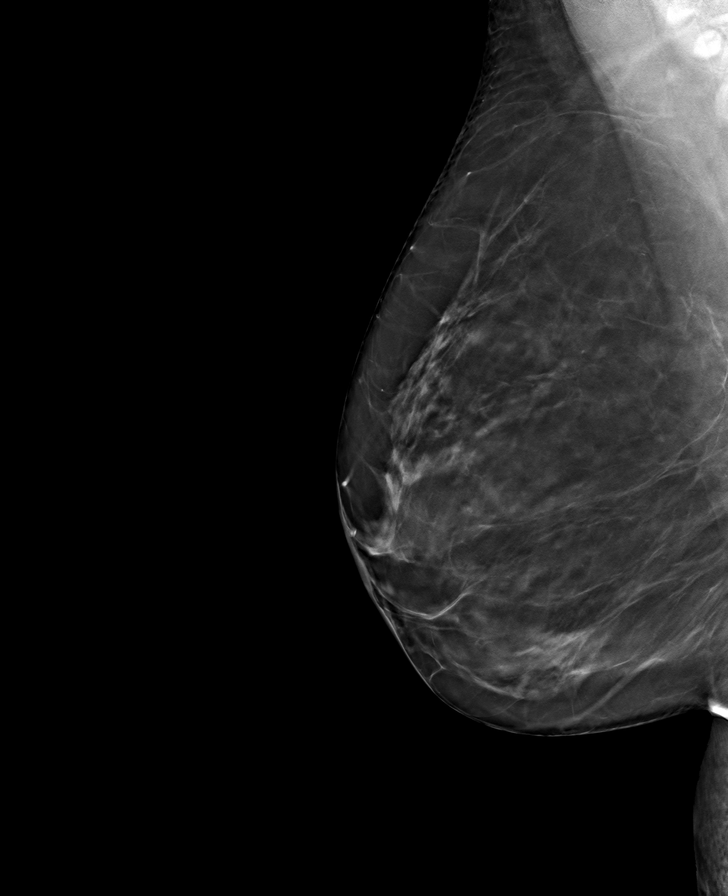

[L CC tomo · tomo slice 43/84.0]
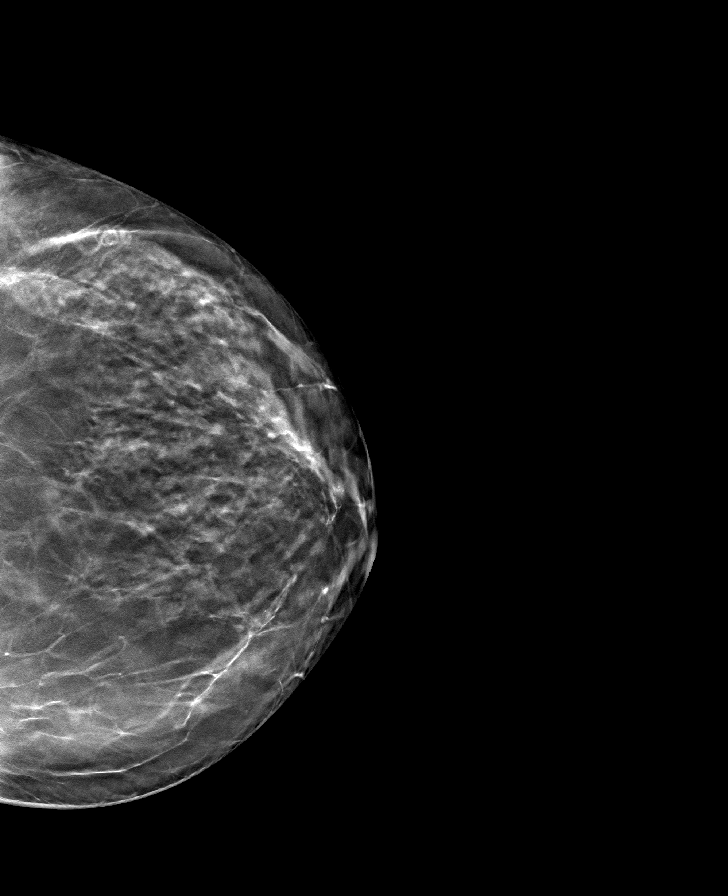

[R CC tomo · tomo slice 47/94.0]
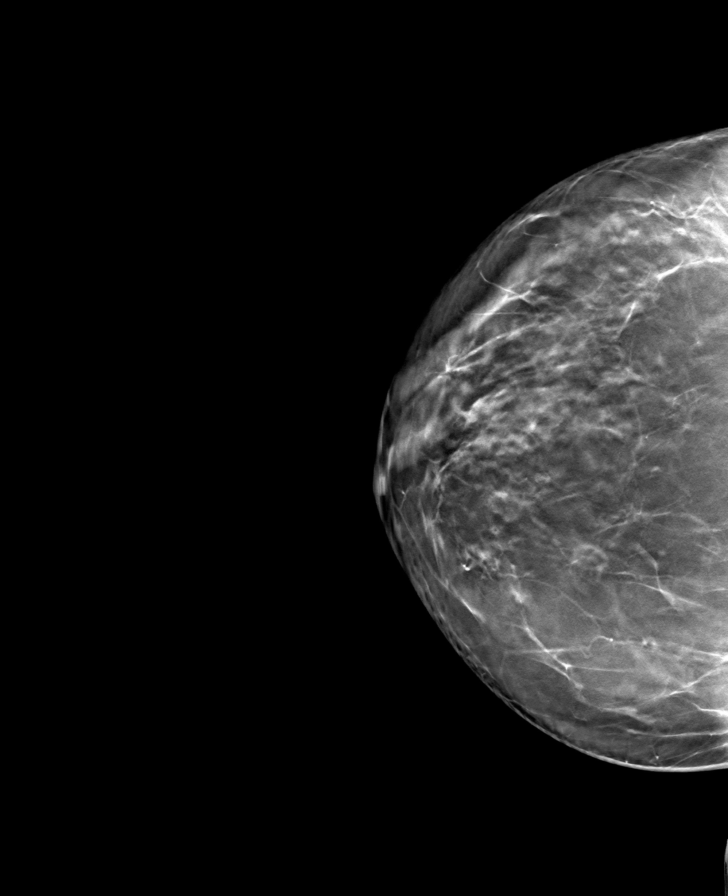

[L MLO tomo · tomo slice 45/89.0]
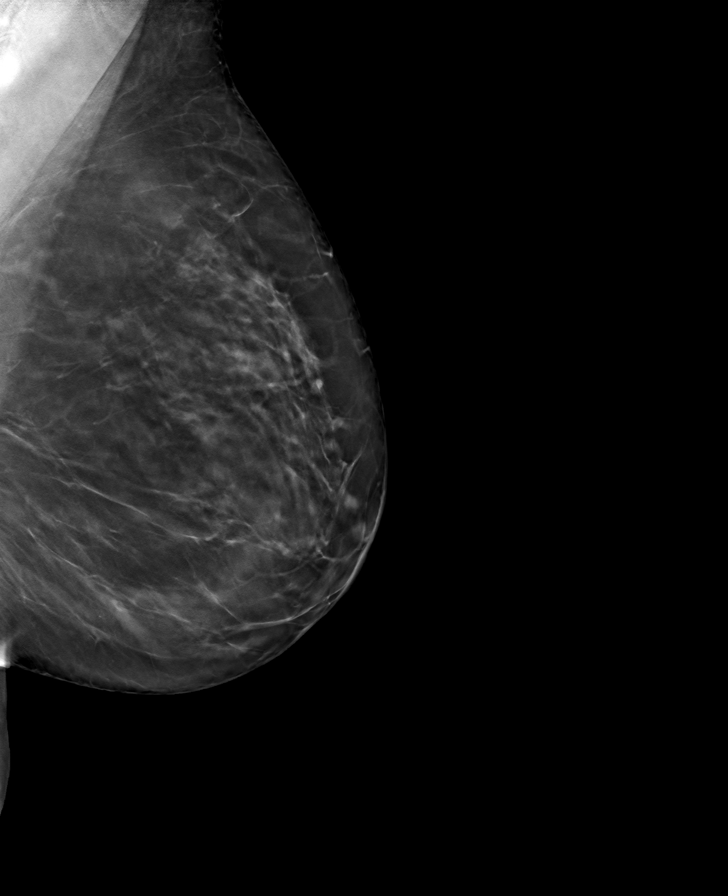

[8 of 24 positions shown; findings below may reference images not displayed]

ACR Breast Density Category b: There are scattered areas of
fibroglandular density.
FINDINGS: There are no findings suspicious for malignancy.
IMPRESSION: No mammographic evidence of malignancy. A result letter of this
screening mammogram will be mailed directly to the patient.

RECOMMENDATION:
Screening mammogram in one year. (Code:51-O-LD2)

BI-RADS CATEGORY  1: Negative.

## 2023-01-17 ENCOUNTER — Other Ambulatory Visit: Payer: Self-pay | Admitting: Obstetrics and Gynecology

## 2023-01-17 DIAGNOSIS — Z1231 Encounter for screening mammogram for malignant neoplasm of breast: Secondary | ICD-10-CM

## 2023-03-06 ENCOUNTER — Ambulatory Visit
Admission: RE | Admit: 2023-03-06 | Discharge: 2023-03-06 | Disposition: A | Discharge status: Home / Self Care | Payer: 59 | Source: Ambulatory Visit | Attending: Obstetrics and Gynecology | Admitting: Obstetrics and Gynecology

## 2023-03-06 DIAGNOSIS — Z1231 Encounter for screening mammogram for malignant neoplasm of breast: Secondary | ICD-10-CM

## 2024-04-24 ENCOUNTER — Other Ambulatory Visit: Payer: Self-pay | Admitting: Obstetrics and Gynecology

## 2024-04-24 DIAGNOSIS — Z1231 Encounter for screening mammogram for malignant neoplasm of breast: Secondary | ICD-10-CM

## 2024-05-21 ENCOUNTER — Ambulatory Visit
Admission: RE | Admit: 2024-05-21 | Discharge: 2024-05-21 | Disposition: A | Source: Ambulatory Visit | Attending: Obstetrics and Gynecology | Admitting: Obstetrics and Gynecology

## 2024-05-21 ENCOUNTER — Ambulatory Visit

## 2024-05-21 DIAGNOSIS — Z1231 Encounter for screening mammogram for malignant neoplasm of breast: Secondary | ICD-10-CM
